# Patient Record
Sex: Male | Born: 1985 | Race: White | Hispanic: No | Marital: Single
Health system: Southern US, Community
[De-identification: ages and names within clinical notes are randomized; demographics above are authoritative.]

---

## 2014-05-11 ENCOUNTER — Emergency Department: Payer: Self-pay | Admitting: Emergency Medicine

## 2014-05-11 LAB — CBC
HCT: 50.9 % (ref 40.0–52.0)
HGB: 16.8 g/dL (ref 13.0–18.0)
MCH: 30 pg (ref 26.0–34.0)
MCHC: 32.9 g/dL (ref 32.0–36.0)
MCV: 91 fL (ref 80–100)
PLATELETS: 238 10*3/uL (ref 150–440)
RBC: 5.59 10*6/uL (ref 4.40–5.90)
RDW: 13.9 % (ref 11.5–14.5)
WBC: 12.7 10*3/uL — AB (ref 3.8–10.6)

## 2014-05-11 LAB — BASIC METABOLIC PANEL
ANION GAP: 8 (ref 7–16)
BUN: 5 mg/dL — AB (ref 7–18)
CALCIUM: 9.1 mg/dL (ref 8.5–10.1)
CHLORIDE: 107 mmol/L (ref 98–107)
CO2: 25 mmol/L (ref 21–32)
CREATININE: 0.83 mg/dL (ref 0.60–1.30)
EGFR (African American): >60
EGFR (Non-African Amer.): >60
Glucose: 83 mg/dL (ref 65–99)
Osmolality: 276 (ref 275–301)
Potassium: 4 mmol/L (ref 3.5–5.1)
SODIUM: 140 mmol/L (ref 136–145)

## 2014-05-11 LAB — DRUG SCREEN, URINE
Amphetamines, Ur Screen: NEGATIVE (ref ?–1000)
Barbiturates, Ur Screen: NEGATIVE (ref ?–200)
Benzodiazepine, Ur Scrn: POSITIVE (ref ?–200)
Cannabinoid 50 Ng, Ur ~~LOC~~: POSITIVE (ref ?–50)
Cocaine Metabolite,Ur ~~LOC~~: NEGATIVE (ref ?–300)
MDMA (Ecstasy)Ur Screen: NEGATIVE (ref ?–500)
Methadone, Ur Screen: NEGATIVE (ref ?–300)
Opiate, Ur Screen: NEGATIVE (ref ?–300)
PHENCYCLIDINE (PCP) UR S: NEGATIVE (ref ?–25)
TRICYCLIC, UR SCREEN: NEGATIVE (ref ?–1000)

## 2014-05-11 LAB — URINALYSIS, COMPLETE
BACTERIA: NONE SEEN
Bilirubin,UR: NEGATIVE
Blood: NEGATIVE
Glucose,UR: NEGATIVE mg/dL (ref 0–75)
Ketone: NEGATIVE
Leukocyte Esterase: NEGATIVE
NITRITE: NEGATIVE
PROTEIN: NEGATIVE
Ph: 5 (ref 4.5–8.0)
RBC, UR: NONE SEEN /HPF (ref 0–5)
Specific Gravity: 1.002 (ref 1.003–1.030)
Squamous Epithelial: NONE SEEN
WBC UR: <1 /HPF (ref 0–5)

## 2014-05-11 LAB — ETHANOL: Ethanol: 147 mg/dL

## 2014-05-27 ENCOUNTER — Emergency Department: Payer: Self-pay | Admitting: Emergency Medicine

## 2014-05-27 LAB — DRUG SCREEN, URINE
Amphetamines, Ur Screen: NEGATIVE (ref ?–1000)
Barbiturates, Ur Screen: NEGATIVE (ref ?–200)
Benzodiazepine, Ur Scrn: POSITIVE (ref ?–200)
Cannabinoid 50 Ng, Ur ~~LOC~~: POSITIVE (ref ?–50)
Cocaine Metabolite,Ur ~~LOC~~: NEGATIVE (ref ?–300)
MDMA (Ecstasy)Ur Screen: NEGATIVE (ref ?–500)
METHADONE, UR SCREEN: NEGATIVE (ref ?–300)
OPIATE, UR SCREEN: NEGATIVE (ref ?–300)
PHENCYCLIDINE (PCP) UR S: NEGATIVE (ref ?–25)
Tricyclic, Ur Screen: NEGATIVE (ref ?–1000)

## 2014-05-27 LAB — COMPREHENSIVE METABOLIC PANEL
ANION GAP: 4 — AB (ref 7–16)
Albumin: 4.3 g/dL (ref 3.4–5.0)
Alkaline Phosphatase: 98 U/L
BUN: 6 mg/dL — ABNORMAL LOW (ref 7–18)
Bilirubin,Total: 0.7 mg/dL (ref 0.2–1.0)
CHLORIDE: 107 mmol/L (ref 98–107)
Calcium, Total: 9 mg/dL (ref 8.5–10.1)
Co2: 29 mmol/L (ref 21–32)
Creatinine: 0.83 mg/dL (ref 0.60–1.30)
EGFR (African American): >60
EGFR (Non-African Amer.): >60
Glucose: 97 mg/dL (ref 65–99)
Osmolality: 277 (ref 275–301)
Potassium: 4.1 mmol/L (ref 3.5–5.1)
SGOT(AST): 155 U/L — ABNORMAL HIGH (ref 15–37)
SGPT (ALT): 212 U/L — ABNORMAL HIGH
Sodium: 140 mmol/L (ref 136–145)
TOTAL PROTEIN: 8.2 g/dL (ref 6.4–8.2)

## 2014-05-27 LAB — URINALYSIS, COMPLETE
Bacteria: NONE SEEN
Bilirubin,UR: NEGATIVE
Blood: NEGATIVE
Glucose,UR: NEGATIVE mg/dL (ref 0–75)
Ketone: NEGATIVE
Leukocyte Esterase: NEGATIVE
Nitrite: NEGATIVE
Ph: 7 (ref 4.5–8.0)
Protein: NEGATIVE
RBC,UR: <1 /HPF (ref 0–5)
Specific Gravity: 1.01 (ref 1.003–1.030)
Squamous Epithelial: <1
WBC UR: 1 /HPF (ref 0–5)

## 2014-05-27 LAB — CBC
HCT: 50.4 % (ref 40.0–52.0)
HGB: 16.8 g/dL (ref 13.0–18.0)
MCH: 30.4 pg (ref 26.0–34.0)
MCHC: 33.3 g/dL (ref 32.0–36.0)
MCV: 92 fL (ref 80–100)
PLATELETS: 228 10*3/uL (ref 150–440)
RBC: 5.51 10*6/uL (ref 4.40–5.90)
RDW: 15.1 % — ABNORMAL HIGH (ref 11.5–14.5)
WBC: 8 10*3/uL (ref 3.8–10.6)

## 2014-05-27 LAB — SALICYLATE LEVEL: Salicylates, Serum: 3.6 mg/dL — ABNORMAL HIGH

## 2014-05-27 LAB — ACETAMINOPHEN LEVEL: Acetaminophen: <2 ug/mL

## 2014-05-27 LAB — ETHANOL: Ethanol: <3 mg/dL

## 2014-12-27 NOTE — Consult Note (Signed)
PATIENT NAME:  Douglas Shepherd, Douglas Shepherd MR#:  [id number] DATE OF BIRTH:  [birth year]  DATE OF CONSULTATION:  05/27/2014  CONSULTING PHYSICIAN:  Audery AmelJohn T. Jolinda Pinkstaff, MD  IDENTIFYING INFORMATION AND REASON FOR CONSULTATION: A 29 year old man presented voluntarily.   CHIEF COMPLAINT: "I've just been feeling terrible." Consult for evaluation and appropriate treatment recommendations.   HISTORY OF PRESENT ILLNESS: Information obtained from the patient and the chart. The patient states that he has been feeling very sad for about the last 5 or 6 days. His friend from childhood committed suicide up in [place]. The patient attended the funeral over the weekend. Since then, his mood has been extremely sad and down. Getting worse. Has been sleeping very poorly at night. Feels anxious and shaky much of the time. He denies any suicidal thoughts about himself. Denies any violent or homicidal thoughts. Denies any hallucinations or psychotic symptoms. The patient is not currently getting any psychiatric treatment. Not on any psychiatric medicine. He also reports that he has a longstanding alcohol problem. He has been continuing to drink the equivalent of about a 6-pack a day. Last drink was this morning. He is having more awareness that his drinking is causing problems for him and is interested in stopping. Medically, he has a history of high blood pressure, but no other significant ongoing medical problems.   PAST PSYCHIATRIC HISTORY: Has had 1 inpatient hospitalization in [place] last year, which happened after making suicidal statements. He was intoxicated at the time. He, temporarily, tried to stop drinking, but has not been able to maintain long-term sobriety. As a child, he was prescribed Paxil for anxiety symptoms, but has not been on any medication recently. No past history of suicide attempts.   SUBSTANCE ABUSE HISTORY: Ongoing alcohol problems. No history of seizures or delirium tremens. Has tried to stop drinking on his own.  Has not been to any inpatient substance abuse treatment programs. Has had only partial success. He also uses marijuana intermittently. He has some insight into how his alcohol has been a problem.   PAST MEDICAL HISTORY: Past history of obesity, but he has lost some weight. Past history of high blood pressure, which recently has not required treatment.   SOCIAL HISTORY: Not currently working. 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 Just recently moved back from [place] to live with his parents here within the last month or so. Apparently, he had been living out of there because of a relationship with a woman that has now broken up. He still stays in touch with his ex-girlfriend, but otherwise, his parents are his closest contacts.   FAMILY HISTORY: Reviewed. The patient does not know of any family history of mental illness or substance abuse.   CURRENT MEDICATIONS: None.   ALLERGIES: No known drug allergies.   REVIEW OF SYSTEMS: Endorses depressed mood, poor sleep, poor appetite, some crying spells. Denies suicidal ideation. Denies hopelessness. Denies any psychotic symptoms. No homicidal ideation. Physically, he is feeling a little bit shaky right now, but otherwise a full 9-point review of symptoms medically is negative.   MENTAL STATUS EXAMINATION: A somewhat disheveled gentleman who looks his stated age, cooperative with the interview. Good eye contact. Normal psychomotor activity. Speech is very quiet, decreased in total amount. Affect is tearful at times, but controlled mood is stated as being bad. Thoughts are lucid without any sign of loosening of associations. Denies any auditory or visual hallucinations. Denies any suicidal or homicidal ideation. The patient is  able to concentrate and attend appropriately to conversation. Short-term memory intact; 0 minutes, he can recall 3 objects and then at 5 minutes, can recall 3 objects. Long-term memory intact. Alert and  oriented x4. Normal intelligence. Normal fund of knowledge.   LABORATORY RESULTS: Drug screen positive for benzodiazepines and cannabis. Chemistry panel: Elevated ALT 212, elevated AST 155, low BUN at 6; otherwise, unremarkable. CBC all normal. Urinalysis all normal.   PHYSICAL EXAMINATION:  NEUROLOGIC:  Mild rest tremor, but controllable.  Normal gait. Cranial nerves symmetric.  SKIN: No skin lesions.   EXTREMITIES: Full range of motion at all extremities.   VITAL SIGNS:  Current blood pressure 163/103, respirations 18, pulse 83, temperature 98.3.   ASSESSMENT: A 29 year old man with acute symptoms of depression. Immediate diagnosis would be adjustment disorder, rule out other depression. Also alcohol abuse. Also marijuana abuse. Also, high blood pressure. The patient is not acutely dangerous, does not require inpatient hospital-level treatment.   TREATMENT PLAN: Reviewed current and past history with the patient. The patient has an acute mood syndrome. Suicidality not currently present, but questioned. The patient is agreeable to outpatient treatment. Counseling and supportive therapy done. He will be referred to [facility] for outpatient treatment and agrees to that. The patient was counseled about his alcohol abuse and strongly encouraged to discontinue alcohol use as well as marijuana use and to talk about this with the counselors at [facility] as well and to be referred to Alcoholics Anonymous. The patient counseled to see a doctor as well about his blood pressure, have his blood pressure monitored, see if this requires further treatment. He was given medication here in the Emergency Room, no medicines need be prescribed at discharge at this time. We reviewed the possibility of medicine and decided against it.   DIAGNOSIS, PRINCIPAL AND PRIMARY:   AXIS I: Adjustment disorder with mood and depression.   SECONDARY DIAGNOSES: AXIS I:  Alcohol abuse. Marijuana abuse.   AXIS II: Deferred.   AXIS III: High  blood pressure.    ____________________________ Audery Amel, MD [email or web address] D: 05/27/2014 18:17:19 ET T: 05/27/2014 18:55:55 ET JOB#: [id number]  cc: Audery Amel, MD, <Dictator> Audery Amel MD ELECTRONICALLY SIGNED 05/29/2014 0:12

## 2014-12-27 NOTE — Consult Note (Signed)
Brief Consult Note: Diagnosis: adjustment disorder.   Patient was seen by consultant.   Consult note dictated.   Discussed with Attending MD.   Comments: Psychiatry: Patient seen. Chart reviewed. Patient having mood symptoms but no suicidality and no dangerousness . Does not require inpt admission. Agrees to outpt referral for his mood and alcohol problems.  He agrees to the plan. Case discussed with ER MD.  Electronic Signatures: Clapacs, Jackquline DenmarkJohn T (MD)  (Signed 22-Sep-15 18:09)  Authored: Brief Consult Note   Last Updated: 22-Sep-15 18:09 by Audery Amellapacs, John T (MD)
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
